# Patient Record
Sex: Male | Born: 1970 | Race: White | Hispanic: No | Smoking: Current some day smoker
Health system: Southern US, Community
[De-identification: ages and names within clinical notes are randomized; demographics above are authoritative.]

## PROBLEM LIST (undated history)

## (undated) DIAGNOSIS — K219 Gastro-esophageal reflux disease without esophagitis: Secondary | ICD-10-CM

## (undated) HISTORY — DX: Gastro-esophageal reflux disease without esophagitis: K21.9

---
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
## 2000-01-07 ENCOUNTER — Emergency Department (HOSPITAL_COMMUNITY): Admission: EM | Admit: 2000-01-07 | Discharge: 2000-01-07 | Payer: Self-pay | Admitting: Emergency Medicine

## 2005-06-26 ENCOUNTER — Ambulatory Visit: Payer: Self-pay | Admitting: Family Medicine

## 2015-04-12 ENCOUNTER — Ambulatory Visit (INDEPENDENT_AMBULATORY_CARE_PROVIDER_SITE_OTHER): Payer: Managed Care, Other (non HMO) | Admitting: Family Medicine

## 2015-04-12 ENCOUNTER — Other Ambulatory Visit: Payer: Self-pay | Admitting: Family Medicine

## 2015-04-12 ENCOUNTER — Ambulatory Visit (INDEPENDENT_AMBULATORY_CARE_PROVIDER_SITE_OTHER): Payer: Managed Care, Other (non HMO)

## 2015-04-12 ENCOUNTER — Encounter: Payer: Self-pay | Admitting: Family Medicine

## 2015-04-12 VITALS — BP 134/88 | HR 73 | Temp 97.1°F | Ht 70.0 in | Wt 207.6 lb

## 2015-04-12 DIAGNOSIS — R52 Pain, unspecified: Secondary | ICD-10-CM

## 2015-04-12 DIAGNOSIS — M546 Pain in thoracic spine: Secondary | ICD-10-CM | POA: Diagnosis not present

## 2015-04-12 DIAGNOSIS — K219 Gastro-esophageal reflux disease without esophagitis: Secondary | ICD-10-CM | POA: Insufficient documentation

## 2015-04-12 MED ORDER — CYCLOBENZAPRINE HCL 10 MG PO TABS: 10.0000 mg | ORAL_TABLET | Freq: Three times a day (TID) | ORAL | Status: DC | PRN

## 2015-04-12 NOTE — Progress Notes (Signed)
BP 134/88 mmHg  Pulse 73  Temp(Src) 97.1 F (36.2 C) (Oral)  Ht 5\' 10"  (1.778 m)  Wt 207 lb 9.6 oz (94.167 kg)  BMI 29.79 kg/m2   Subjective:    Patient ID: Samuel Prince, male    DOB: 1970/08/14, 44 y.o.   MRN: [id number]  HPI: Samuel Prince is a 44 y.o. male presenting on 04/12/2015 for Back Pain   HPI Fall and back pain Patient was working 4 days ago in his yard and moving a tree and then was pulling some weeds and one got stuck really hard and when he ate.he pulled backwards and fell and landed on a part of the tree stump. It landed just below his scapula on the left side. He was doing fine over the weekend because he is more relaxing but then yesterday he went to work and was having a lot of pain in issue with movement. In today just to make sure it wasn't broken. He denies any pain radiating anywhere else. His pain is minimal when not moving or not been palpated but goes up to a 6 out of 10 when he moves it or when somebody pushes on it.  Relevant past medical, surgical, family and social history reviewed and updated as indicated. Interim medical history since our last visit reviewed. Allergies and medications reviewed and updated.  Review of Systems  Constitutional: Negative for fever.  HENT: Negative for ear discharge and ear pain.   Eyes: Negative for discharge and visual disturbance.  Respiratory: Negative for shortness of breath and wheezing.   Cardiovascular: Negative for chest pain and leg swelling.  Gastrointestinal: Negative for abdominal pain, diarrhea and constipation.  Genitourinary: Negative for difficulty urinating.  Musculoskeletal: Positive for back pain. Negative for gait problem.  Skin: Negative for rash.  Neurological: Negative for dizziness, syncope, light-headedness and headaches.  All other systems reviewed and are negative.   Per HPI unless specifically indicated above     Medication List       This list is accurate as of: 04/12/15   9:31 AM.  Always use your most recent med list.               cyclobenzaprine 10 MG tablet  Commonly known as:  FLEXERIL  Take 1 tablet (10 mg total) by mouth 3 (three) times daily as needed for muscle spasms.     esomeprazole 40 MG capsule  Commonly known as:  NEXIUM  Take 40 mg by mouth daily at 12 noon.     Magnesium Salicylate 325 MG Tabs  Take by mouth.           Objective:    BP 134/88 mmHg  Pulse 73  Temp(Src) 97.1 F (36.2 C) (Oral)  Ht 5\' 10"  (1.778 m)  Wt 207 lb 9.6 oz (94.167 kg)  BMI 29.79 kg/m2  Wt Readings from Last 3 Encounters:  04/12/15 207 lb 9.6 oz (94.167 kg)    Physical Exam  Constitutional: He is oriented to person, place, and time. He appears well-developed and well-nourished. No distress.  Eyes: Conjunctivae and EOM are normal. Pupils are equal, round, and reactive to light. Right eye exhibits no discharge. No scleral icterus.  Cardiovascular: Normal rate, regular rhythm, normal heart sounds and intact distal pulses.   No murmur heard. Pulmonary/Chest: Effort normal and breath sounds normal. No respiratory distress. He has no wheezes.  Musculoskeletal: Normal range of motion. He exhibits no edema.       Thoracic back:  He exhibits tenderness. He exhibits normal range of motion, no deformity and no laceration.       Back:  Neurological: He is alert and oriented to person, place, and time. Coordination normal.  Skin: Skin is warm and dry. No rash noted. He is not diaphoretic.  Psychiatric: He has a normal mood and affect. His behavior is normal.  Vitals reviewed.  Rib/chest x-ray: Rib and chest x-ray do not show any acute bony abnormalities. This is a preliminary read by Dr. Louanne Skye, await final read by radiologist.  No results found for this or any previous visit.    Assessment & Plan:   Problem List Items Addressed This Visit    None    Visit Diagnoses    Left-sided thoracic back pain    -  Primary    Had fall and landed on a tree  stump, likely deep contusion. Await final read by radiology    Relevant Medications    Magnesium Salicylate 325 MG TABS    cyclobenzaprine (FLEXERIL) 10 MG tablet        Follow up plan: Return in about 4 weeks (around 05/10/2015), or if symptoms worsen or fail to improve, for Well adult exam and screening labs.  Counseling provided for all of the vaccine components No orders of the defined types were placed in this encounter.    Arville Care, MD [facility] 04/12/2015, 9:31 AM

## 2015-05-06 ENCOUNTER — Encounter: Payer: Managed Care, Other (non HMO) | Admitting: Family Medicine

## 2015-05-09 ENCOUNTER — Encounter: Payer: Self-pay | Admitting: Family Medicine

## 2016-02-04 ENCOUNTER — Ambulatory Visit (INDEPENDENT_AMBULATORY_CARE_PROVIDER_SITE_OTHER): Payer: Managed Care, Other (non HMO) | Admitting: Family Medicine

## 2016-02-04 VITALS — BP 131/83 | HR 71 | Temp 97.7°F | Ht 70.0 in | Wt 194.0 lb

## 2016-02-04 DIAGNOSIS — L209 Atopic dermatitis, unspecified: Secondary | ICD-10-CM

## 2016-02-04 MED ORDER — METHYLPREDNISOLONE ACETATE 80 MG/ML IJ SUSP
40.0000 mg | Freq: Once | INTRAMUSCULAR | Status: AC
Start: 2016-02-04 — End: 2016-02-04
  Administered 2016-02-04: 40 mg via INTRAMUSCULAR

## 2016-02-04 NOTE — Progress Notes (Signed)
BP 131/83   Pulse 71   Temp 97.7 F (36.5 C) (Oral)   Ht 5\' 10"  (1.778 m)   Wt 194 lb (88 kg)   BMI 27.84 kg/m    Subjective:    Patient ID: Samuel Prince, male    DOB: [birth year], 45 y.o.   MRN: [id number]  HPI: Samuel Prince is a 45 y.o. male presenting on 02/04/2016 for No chief complaint on file.   HPI Chigger bites and rash and pruritus Patient is coming in because he has had chigger bites that just started overnight. He is working with a lot of hay yesterday and the thinks that is where he got them from. He denies any fevers or chills or purulent drainage from any of the sites. They're just mainly small pink bumps that are very pruritic. He comes in today to see if he can get something to help with the pruritus. Discussed that a steroid shot for this is not usual protocol but patient is very insistent and understands the risks of it and would like to get one anyways.  Relevant past medical, surgical, family and social history reviewed and updated as indicated. Interim medical history since our last visit reviewed. Allergies and medications reviewed and updated.  Review of Systems  Constitutional: Negative for chills and fever.  Eyes: Negative for discharge.  Respiratory: Negative for shortness of breath and wheezing.   Cardiovascular: Negative for chest pain and leg swelling.  Musculoskeletal: Negative for back pain and gait problem.  Skin: Positive for rash.  All other systems reviewed and are negative.   Per HPI unless specifically indicated above     Medication List    as of 02/04/2016  8:36 AM   You have not been prescribed any medications.        Objective:    BP 131/83   Pulse 71   Temp 97.7 F (36.5 C) (Oral)   Ht 5\' 10"  (1.778 m)   Wt 194 lb (88 kg)   BMI 27.84 kg/m   Wt Readings from Last 3 Encounters:  02/04/16 194 lb (88 kg)  04/12/15 207 lb 9.6 oz (94.2 kg)    Physical Exam  Constitutional: He is oriented to person, place, and  time. He appears well-developed and well-nourished. No distress.  Eyes: Conjunctivae are normal. Right eye exhibits no discharge. No scleral icterus.  Cardiovascular: Normal rate, regular rhythm, normal heart sounds and intact distal pulses.   No murmur heard. Pulmonary/Chest: Effort normal and breath sounds normal. No respiratory distress. He has no wheezes.  Musculoskeletal: Normal range of motion. He exhibits no edema.  Neurological: He is alert and oriented to person, place, and time. Coordination normal.  Skin: Skin is warm and dry. Rash noted. Rash is papular (Small pink papules scattered on belt line and legs and arms. Patient says they have a lot of pruritus, no erythema or warmth). He is not diaphoretic.  Psychiatric: He has a normal mood and affect. His behavior is normal.  Nursing note and vitals reviewed.   No results found for this or any previous visit.    Assessment & Plan:   Problem List Items Addressed This Visit    None    Visit Diagnoses    Atopic dermatitis, unspecified type    -  Primary   Patient has what appear to be chigger bites on lower abdomen arms and legs, he had a steroid shot previously when he had poison ivy with this and wants one  Relevant Medications   methylPREDNISolone acetate (DEPO-MEDROL) injection 40 mg (Start on 02/04/2016  8:45 AM)      Instructed patient on the risks and benefits of steroid shots that is likely not needed for this 1 the patient is very insistent that he wants 1 and understands the risks of it.  Follow up plan: Return if symptoms worsen or fail to improve.  Counseling provided for all of the vaccine components No orders of the defined types were placed in this encounter.   Arville CareJoshua Kendalyn Cranfield, MD [facility] 02/04/2016, 8:36 AM

## 2016-09-29 IMAGING — CR DG RIBS W/ CHEST 3+V*L*
3 series · 3 of 3 positions shown · non-contrast
Comparison: None in PACs

CLINICAL DATA: No acute left rib fracture is observed.

EXAM:
LEFT RIBS AND CHEST - 3+ VIEW

[view not recorded (1 of 3)]
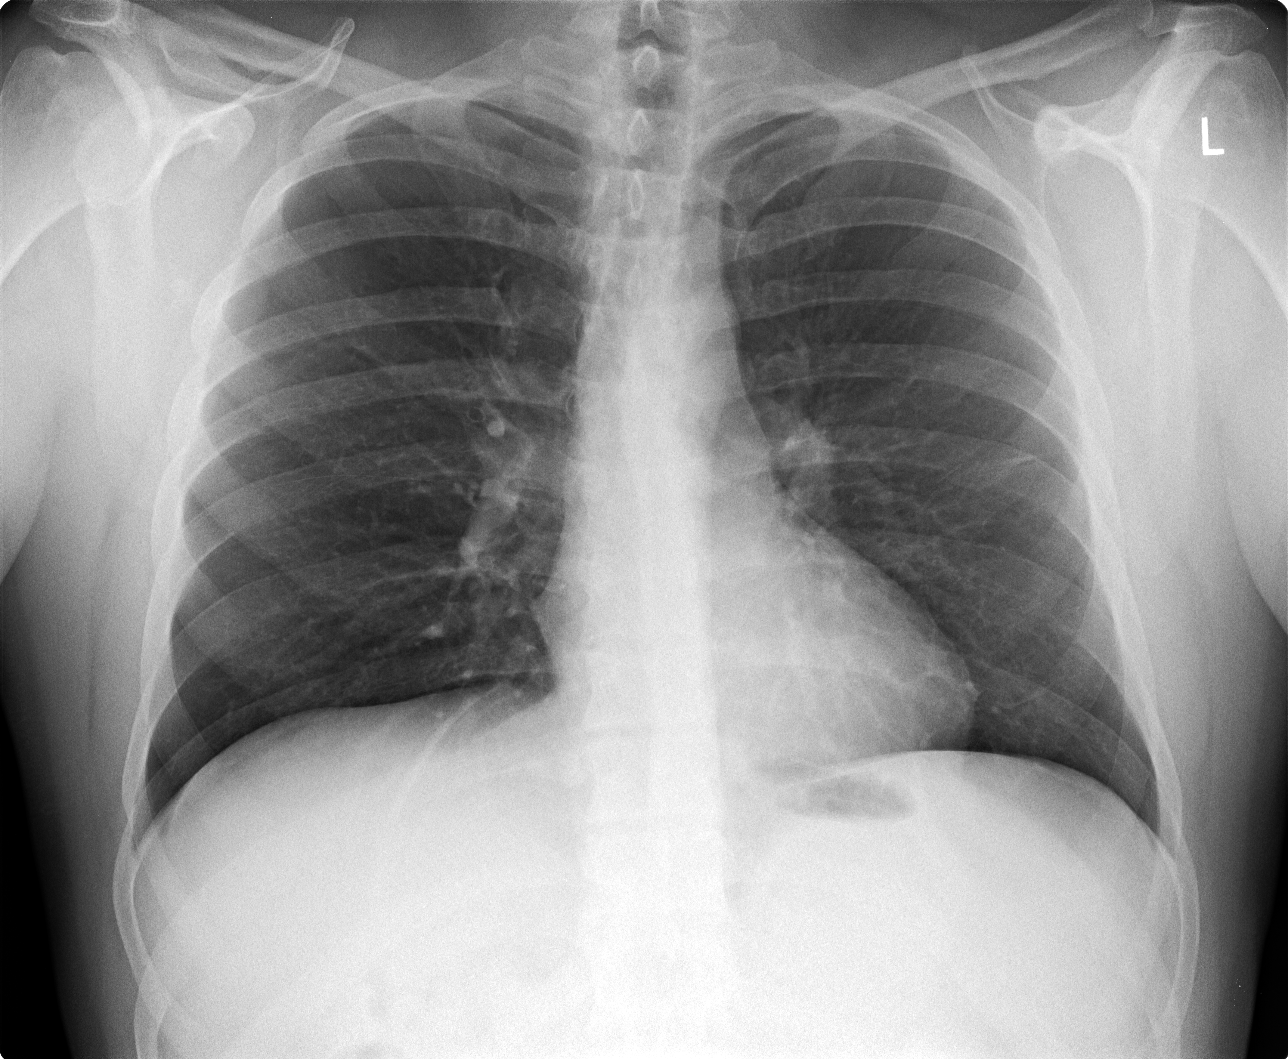

[view not recorded (2 of 3)]
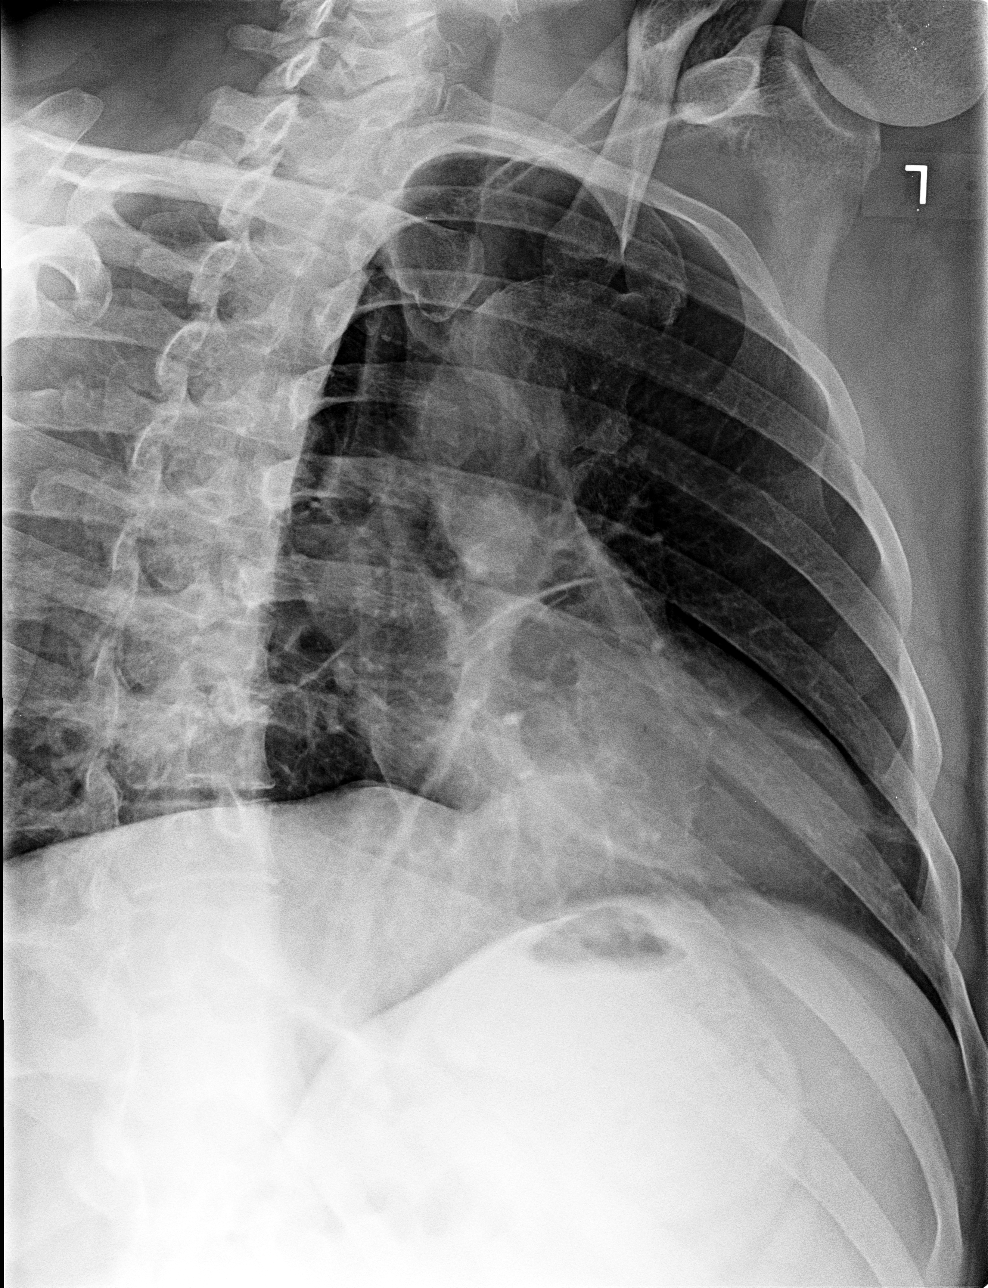

[view not recorded (3 of 3)]
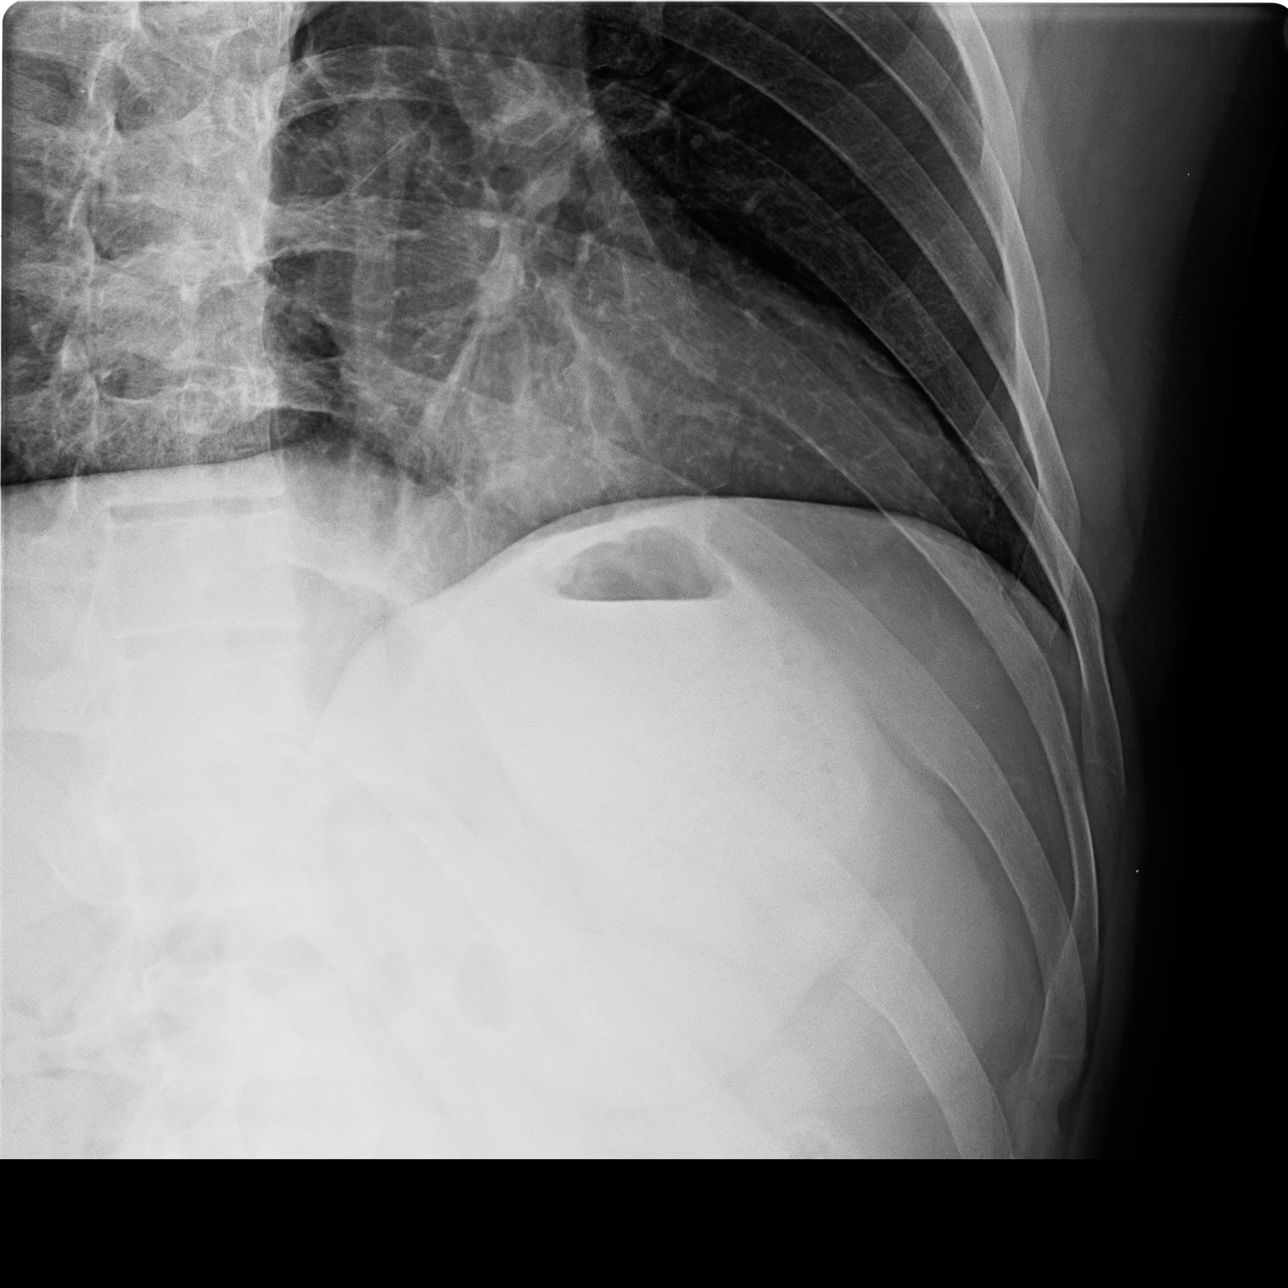

[3 of 3 positions shown; findings below may reference images not displayed]

FINDINGS: The lungs are adequately inflated. There is no focal infiltrate.
Minimal scarring versus subsegmental atelectasis is present on the
left in the lateral aspect of the mid lung. There is no pneumothorax
or pleural effusion or evidence of a pulmonary contusion. The heart,
pulmonary vascularity, and mediastinal structures are normal. The
thoracic vertebral bodies appear preserved in height. Left rib
detail views reveal no acute displaced fractures.
IMPRESSION: 1. There is no evidence of a pulmonary contusion, pneumothorax, or
hemo thorax. There is minimal scarring versus subsegmental
atelectasis peripherally in the left mid lung.
2. There is no acute abnormality observed elsewhere within the
thorax.
